# Patient Record
Sex: Female | Born: 1980 | Race: White | Hispanic: No | Marital: Single | Smoking: Former smoker
Health system: Southern US, Community
[De-identification: ages and names within clinical notes are randomized; demographics above are authoritative.]

## PROBLEM LIST (undated history)

## (undated) DIAGNOSIS — Z789 Other specified health status: Secondary | ICD-10-CM

## (undated) HISTORY — PX: GALLBLADDER SURGERY: SHX652

## (undated) HISTORY — PX: ANKLE SURGERY: SHX546

---
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
## 2001-08-31 ENCOUNTER — Other Ambulatory Visit: Admission: RE | Admit: 2001-08-31 | Discharge: 2001-08-31 | Payer: Self-pay | Admitting: Specialist

## 2001-08-31 ENCOUNTER — Other Ambulatory Visit: Admission: RE | Admit: 2001-08-31 | Discharge: 2001-08-31 | Payer: Self-pay | Admitting: Family Medicine

## 2011-01-31 ENCOUNTER — Emergency Department (HOSPITAL_COMMUNITY)
Admission: EM | Admit: 2011-01-31 | Discharge: 2011-01-31 | Disposition: A | Discharge status: Home / Self Care | Payer: Medicaid Other | Attending: Emergency Medicine | Admitting: Emergency Medicine

## 2011-01-31 ENCOUNTER — Emergency Department (HOSPITAL_COMMUNITY): Payer: Medicaid Other

## 2011-01-31 DIAGNOSIS — IMO0002 Reserved for concepts with insufficient information to code with codable children: Secondary | ICD-10-CM | POA: Insufficient documentation

## 2011-01-31 DIAGNOSIS — S139XXA Sprain of joints and ligaments of unspecified parts of neck, initial encounter: Secondary | ICD-10-CM | POA: Insufficient documentation

## 2011-01-31 DIAGNOSIS — M542 Cervicalgia: Secondary | ICD-10-CM | POA: Insufficient documentation

## 2011-01-31 DIAGNOSIS — T07XXXA Unspecified multiple injuries, initial encounter: Secondary | ICD-10-CM | POA: Insufficient documentation
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
## 2011-12-18 ENCOUNTER — Encounter (HOSPITAL_COMMUNITY): Payer: Self-pay | Admitting: *Deleted

## 2011-12-18 ENCOUNTER — Emergency Department (HOSPITAL_COMMUNITY): Payer: Medicaid Other

## 2011-12-18 ENCOUNTER — Emergency Department (HOSPITAL_COMMUNITY)
Admission: EM | Admit: 2011-12-18 | Discharge: 2011-12-18 | Disposition: A | Discharge status: Home / Self Care | Payer: Medicaid Other | Attending: Emergency Medicine | Admitting: Emergency Medicine

## 2011-12-18 DIAGNOSIS — X500XXA Overexertion from strenuous movement or load, initial encounter: Secondary | ICD-10-CM | POA: Insufficient documentation

## 2011-12-18 DIAGNOSIS — S82851A Displaced trimalleolar fracture of right lower leg, initial encounter for closed fracture: Secondary | ICD-10-CM

## 2011-12-18 DIAGNOSIS — Z882 Allergy status to sulfonamides status: Secondary | ICD-10-CM | POA: Insufficient documentation

## 2011-12-18 DIAGNOSIS — S82853A Displaced trimalleolar fracture of unspecified lower leg, initial encounter for closed fracture: Secondary | ICD-10-CM | POA: Insufficient documentation

## 2011-12-18 MED ORDER — ONDANSETRON HCL 4 MG PO TABS: 4.0000 mg | ORAL_TABLET | Freq: Four times a day (QID) | ORAL | Status: AC

## 2011-12-18 MED ORDER — FENTANYL CITRATE 0.05 MG/ML IJ SOLN
50.0000 ug | Freq: Once | INTRAMUSCULAR | Status: AC
  Filled 2011-12-18: qty 2

## 2011-12-18 MED ORDER — OXYCODONE-ACETAMINOPHEN 5-325 MG PO TABS: 2.0000 | ORAL_TABLET | ORAL | Status: AC | PRN

## 2011-12-18 MED ORDER — MORPHINE SULFATE 4 MG/ML IJ SOLN
4.0000 mg | Freq: Once | INTRAMUSCULAR | Status: AC
  Administered 2011-12-18: 4 mg via INTRAVENOUS
  Filled 2011-12-18: qty 1

## 2011-12-18 MED ORDER — HYDROMORPHONE HCL PF 1 MG/ML IJ SOLN
0.5000 mg | Freq: Once | INTRAMUSCULAR | Status: AC
  Administered 2011-12-18: 0.5 mg via INTRAVENOUS
  Filled 2011-12-18: qty 1

## 2011-12-18 MED ORDER — HYDROMORPHONE HCL PF 1 MG/ML IJ SOLN
1.0000 mg | Freq: Once | INTRAMUSCULAR | Status: AC
  Administered 2011-12-18: 1 mg via INTRAVENOUS
  Filled 2011-12-18: qty 1

## 2011-12-18 MED ORDER — ONDANSETRON HCL 4 MG/2ML IJ SOLN
4.0000 mg | Freq: Once | INTRAMUSCULAR | Status: AC
  Administered 2011-12-18: 4 mg via INTRAVENOUS
  Filled 2011-12-18: qty 2

## 2011-12-18 NOTE — ED Notes (Signed)
[id number]<AV> Expected date:<BR> Expected time:<BR> Means of arrival:<BR> Comments:<BR> EMS/chest wall pain and dizziness

## 2011-12-18 NOTE — ED Notes (Signed)
Spoke with Jonny Ruiz, ortho tech. Was told he'll be here in 45 min.

## 2011-12-18 NOTE — ED Notes (Signed)
Per EMS:  Pt was walking in high heels and tripped and rolled her ankle.  No serious obvious deformity, not significant swelling.  Pulses present, pt able to move toes.  Pt st's she feels crepitus.

## 2011-12-18 NOTE — ED Notes (Signed)
Patient transported to X-ray 

## 2011-12-18 NOTE — ED Notes (Signed)
Ortho tech John in with pt
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
## 2012-04-08 ENCOUNTER — Other Ambulatory Visit: Payer: Self-pay

## 2012-05-19 ENCOUNTER — Other Ambulatory Visit (HOSPITAL_COMMUNITY): Payer: Self-pay | Admitting: Obstetrics and Gynecology

## 2012-05-19 DIAGNOSIS — Z87728 Personal history of other specified (corrected) congenital malformations of nervous system and sense organs: Secondary | ICD-10-CM

## 2012-06-17 ENCOUNTER — Other Ambulatory Visit: Payer: Self-pay

## 2012-06-24 ENCOUNTER — Ambulatory Visit (HOSPITAL_COMMUNITY)
Admission: RE | Admit: 2012-06-24 | Discharge: 2012-06-24 | Disposition: A | Discharge status: Home / Self Care | Payer: Medicaid Other | Source: Ambulatory Visit | Attending: Obstetrics and Gynecology | Admitting: Obstetrics and Gynecology

## 2012-06-24 ENCOUNTER — Encounter (HOSPITAL_COMMUNITY): Payer: Self-pay

## 2012-06-24 DIAGNOSIS — Z8751 Personal history of pre-term labor: Secondary | ICD-10-CM | POA: Insufficient documentation

## 2012-06-24 DIAGNOSIS — Z1389 Encounter for screening for other disorder: Secondary | ICD-10-CM | POA: Insufficient documentation

## 2012-06-24 DIAGNOSIS — O358XX0 Maternal care for other (suspected) fetal abnormality and damage, not applicable or unspecified: Secondary | ICD-10-CM | POA: Insufficient documentation

## 2012-06-24 DIAGNOSIS — Z363 Encounter for antenatal screening for malformations: Secondary | ICD-10-CM | POA: Insufficient documentation

## 2012-06-24 DIAGNOSIS — Z87728 Personal history of other specified (corrected) congenital malformations of nervous system and sense organs: Secondary | ICD-10-CM

## 2012-06-24 DIAGNOSIS — O352XX0 Maternal care for (suspected) hereditary disease in fetus, not applicable or unspecified: Secondary | ICD-10-CM | POA: Insufficient documentation

## 2012-06-28 ENCOUNTER — Ambulatory Visit (HOSPITAL_COMMUNITY): Payer: No Typology Code available for payment source

## 2013-04-29 ENCOUNTER — Encounter (HOSPITAL_COMMUNITY): Payer: Self-pay | Admitting: *Deleted

## 2014-04-02 ENCOUNTER — Encounter (HOSPITAL_COMMUNITY): Payer: Self-pay | Admitting: *Deleted

## 2016-05-29 ENCOUNTER — Other Ambulatory Visit (HOSPITAL_COMMUNITY): Payer: Self-pay | Admitting: Obstetrics and Gynecology

## 2016-05-29 ENCOUNTER — Encounter (HOSPITAL_COMMUNITY): Payer: Self-pay | Admitting: Obstetrics and Gynecology

## 2016-05-29 DIAGNOSIS — Q059 Spina bifida, unspecified: Secondary | ICD-10-CM

## 2016-05-29 DIAGNOSIS — Z3689 Encounter for other specified antenatal screening: Secondary | ICD-10-CM

## 2016-05-29 DIAGNOSIS — Z3A21 21 weeks gestation of pregnancy: Secondary | ICD-10-CM

## 2016-05-29 DIAGNOSIS — O280 Abnormal hematological finding on antenatal screening of mother: Secondary | ICD-10-CM

## 2016-06-10 ENCOUNTER — Encounter (HOSPITAL_COMMUNITY): Payer: Self-pay | Admitting: *Deleted

## 2016-06-11 ENCOUNTER — Ambulatory Visit (HOSPITAL_COMMUNITY)
Admission: RE | Admit: 2016-06-11 | Discharge: 2016-06-11 | Disposition: A | Discharge status: Home / Self Care | Payer: Medicaid Other | Source: Ambulatory Visit | Attending: Specialist | Admitting: Specialist

## 2016-06-11 ENCOUNTER — Encounter (HOSPITAL_COMMUNITY): Payer: Self-pay

## 2016-06-11 DIAGNOSIS — Z315 Encounter for genetic counseling: Secondary | ICD-10-CM | POA: Diagnosis not present

## 2016-06-11 DIAGNOSIS — Z3A21 21 weeks gestation of pregnancy: Secondary | ICD-10-CM

## 2016-06-11 DIAGNOSIS — O99322 Drug use complicating pregnancy, second trimester: Secondary | ICD-10-CM | POA: Diagnosis not present

## 2016-06-11 DIAGNOSIS — Z363 Encounter for antenatal screening for malformations: Secondary | ICD-10-CM | POA: Diagnosis not present

## 2016-06-11 DIAGNOSIS — F1120 Opioid dependence, uncomplicated: Secondary | ICD-10-CM

## 2016-06-11 DIAGNOSIS — Z3689 Encounter for other specified antenatal screening: Secondary | ICD-10-CM

## 2016-06-11 DIAGNOSIS — O280 Abnormal hematological finding on antenatal screening of mother: Secondary | ICD-10-CM | POA: Insufficient documentation

## 2016-06-11 DIAGNOSIS — O09522 Supervision of elderly multigravida, second trimester: Secondary | ICD-10-CM | POA: Insufficient documentation

## 2016-06-11 DIAGNOSIS — O285 Abnormal chromosomal and genetic finding on antenatal screening of mother: Secondary | ICD-10-CM | POA: Insufficient documentation

## 2016-06-11 DIAGNOSIS — Q059 Spina bifida, unspecified: Secondary | ICD-10-CM

## 2016-06-11 DIAGNOSIS — O283 Abnormal ultrasonic finding on antenatal screening of mother: Secondary | ICD-10-CM | POA: Insufficient documentation

## 2016-06-11 DIAGNOSIS — O09529 Supervision of elderly multigravida, unspecified trimester: Secondary | ICD-10-CM | POA: Insufficient documentation

## 2016-06-11 HISTORY — DX: Other specified health status: Z78.9

## 2016-06-11 NOTE — Consult Note (Addendum)
MFM consult  48/36 yr old [id number] at 4w2dwith reported history of "spina bifida" and previous late preterm delivery referred by Dr. DMicah Noelfor fetal anatomic survey and consult. Pregnancy complicated by suboxone maintenance and elevated inhibin of 2.19MoM.  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 Fetal biometry is consistent with dating. Posterior placenta without evidence of previa. Normal amniotic fluid volume.  Normal transabdominal cervical length. The views of the profile/nasal bone, palate, and heart are limited. The remainder of the limited fetal anatomic survey is normal.  I counseled the patient as follows: 1. Appropriate fetal growth. 2. Limited fetal anatomic survey: - recommend follow up in 4 weeks to reevalute fetal anatomy 3. Elevated inhibin: - patient met with the genetic counselor; see separate report - had elevated risk of Down Syndrome on quad screen; had low risk cell free fetal DNA - I discussed the association with fetal aneuploidy. I also discussed the association with increased risk of adverse pregnancy outcomes including increased risk of fetal growth restriction, preeclampsia, stillbirth, preterm birth, and abruption. I discussed these risks are only slightly increased over baseline but heightened surveillance is warranted. I recommend serial fetal growth ultrasounds. I recommend antenatal testing only in the setting of fetal growth restriction. I recommend close surveillance for the development of signs/symptoms of preterm labor, PPROM, and preeclampsia.  4. Suboxone maintenance: - in patients who have an addiction to narcotics or have chronic pain, buprenorphine maintenance therapy has been shown to decrease adverse events in pregnant women  when compared to continuing non controlled doses of narcotics. A stable buprenorphine dose reduces stress on the fetus from repeated withdrawal due to inconsistent availability of illicit drugs. Additional benefits include a potential reduction in drug-seeking behaviors  - it is unclear if buprenorphine use is associated with teratogenicity; there have been some reports to suggest a slight increase in congenital anomalies with buprenorphine use  - there is an increased risk of preterm delivery, fetal growth restriction, and NICU admission with buprenorphine therapy in pregnancy  - there is a risk of neonatal abstinence syndrome. I explained the neonate will be closely monitored for signs/symptoms of withdrawal and will be treated as necessary. The neonates are often treated with opiates and slowly weaned- a process that can take days to weeks  - the recommendations are to continue a maintenance dose of buprenorphine in pregnancy as stopping buprenorphine in pregnancy has a higher risk of withdrawal, fetal death, and risk of use of illicit drugs  - buprenorphine should be continued after delivery. Additional pain medication should be given as indicated. Buprenorphine should not be used to treat pain related to delivery. Nubain should be avoided given it is a partial opiate antagonist.  - In the treatment of addiction involving opioid use in pregnant women, the buprenorphine/naloxone combination product is not recommended for use (insufficient evidence); however, the buprenorphine monoproduct is a reasonable and recommended option for use. Women who become pregnant while on this combination should generally be transitioned to the single agent (buprenorphine) product (ACOG 2012). I advised the patient to discuss this with her provider.  - I recommend serial ultrasounds for fetal growth every 4 weeks. Antenatal testing should be started only in the setting of fetal growth restriction.  - recommend fetal growth  every 4 weeks -  recommend inform pediatrics at delivery 5. Late preterm/early term delivery: - patient reports she delivered 3-4 weeks early  - at the time she was very sick with the flu and had PPROM and was subsequently induced given gestational age - discussed if she did deliver preterm there is increased risk in this pregnancy of preterm delivery; however that risk is decreased given 2 other full term deliveries - discussed option of makena in reducing this risk; however given delivered close to term or at term it would be reasonable for her to choose to use makena or not - recommend preterm labor precautions 6. 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 as an infant and was told she has "brain changes consistent with spina bifida" - she has never had surgery; does not know what the brain changes are; and has no neurologic sequelae - encouraged the patient to obtain her old records and speak with her mother to obtain more information - she reports no headaches or vision changes and reports never had a VP shunt - regardless given she is asymptomatic and did well with her 3 other vaginal deliveries (and had regional anesthesia) I discussed she is likely to do well from this standpoint in this pregnancy as well - recommend Anesthesia consult in the third trimester 7. Advanced maternal age: - patient met with the genetic counselor; see separate report - low risk cell free fetal DNA  I spent a total of 45 minutes with the patient of which >50% was in face to face consultation.  [place], MD

## 2016-06-11 NOTE — Progress Notes (Signed)
Genetic Counseling  High-Risk Gestation Note  Appointment Date:  06/11/2016 Referred By: Kelli Bustard, MD Date of Birth:  1981/01/31   Pregnancy History: [id number] Estimated Date of Delivery: 10/20/16 Estimated Gestational Age: [redacted]w[redacted]d Attending: Eulis Foster, MD   Kelli Sparks was seen for genetic counseling regarding a maternal age of 36 years old at delivery and an increased risk for Down syndrome based on Quad screen through [employer].   In summary:  Discussed AMA and associated risk for fetal aneuploidy  Reviewed results of previous screening  Quad screening: 1 in 172 Down syndrome risk  NIPS ([facility]) - low risk (1 in >10,000) for Down syndrome, trisomy 78, and trisomy 19  Discussed options for additional screening  Ultrasound- performed today, complete results under separate cover  Discussed diagnostic testing options  Amniocentesis- declined  Reviewed family history concerns  Patient reported personal history of possible spina bifida- reported no symptoms and has not required surgical correction  Discussed that description could possibly fit with spina bifida occulta  In the case of asymptomatic spina bifida occulta, recurrence risk for ONTDs would not be increased for relatives  Additional information regarding this medical history may alter recurrence risk assessment  Discussed carrier screening options (CF, SMA, hemoglobinopathies) - declined  She was counseled regarding maternal age and the association with risk for chromosome conditions due to nondisjunction with aging of the ova.  We reviewed chromosomes, nondisjunction, and the associated risk for fetal aneuploidy at [redacted]w[redacted]d gestation related to a maternal age of 36 years old at delivery.  They were counseled that the risk for aneuploidy decreases as gestational age increases, accounting for those pregnancies which spontaneously abort.  We specifically discussed Down syndrome (trisomy 34),  trisomies 62 and 56, and sex chromosome aneuploidies (47,XXX and 47,XXY) including the common features and prognoses of each.   We reviewed Kelli Sparks' maternal serum Quad screen result and the associated increase in risk for fetal Down syndrome (1 in 324 to 1 in 172).  She was counseled regarding other explanations for a screen positive result including normal variation and differences in maternal metabolism.  In addition, we reviewed the screen adjusted reduction in risks for trisomy 18 and ONTDs. The levels of one of the analytes, DIA, on the Quad screen was elevated (2.19 MoM). This is associated with an increased risk for adverse pregnancy outcomes and thus, a follow up ultrasound in the third trimester to assess fetal growth may be warranted. She understands that Quad screening provides a pregnancy specific risk for Down syndrome, but is not considered to be diagnostic.    Kelli Sparks subsequently had noninvasive prenatal screening (NIPS)/cell free DNA (cfDNA) testing performed through her OB office. We reviewed these results. Specifically, Kelli Sparks had [facility] testing. We reviewed that the results from this testing are within normal range, indicating less than 1 in 10,000 risk for fetal Down syndrome (trisomy 21), trisomy 61, and trisomy 80.   We reviewed that NIPS analyzes specific placental (fetal) DNA in maternal circulation. NIPS is considered to be highly specific and sensitive, but is not considered to be a diagnostic test. We reviewed that this testing identifies the majority of pregnancies with trisomies 21, 67, and 18.   We reviewed other available options including detailed ultrasound and amniocentesis. We reviewed the approximate 1 in 300-500 risk for complications for amniocentesis, including spontaneous pregnancy loss. Kelli Sparks understands that given the normal NIPS ([facility]) result, the risk of fetal aneuploidy is  much lower than the risk of a complication related to  diagnostic invasive testing. After consideration of these options, she declined amniocentesis.  A complete ultrasound was performed today. The ultrasound report will be documented separately. She understands that screening cannot diagnose or rule out all birth defects or genetic conditions.   Kelli Sparks was provided with written information regarding cystic fibrosis (CF), spinal muscular atrophy (SMA) and hemoglobinopathies including the carrier frequency, availability of carrier screening and prenatal diagnosis if indicated.  In addition, we discussed that CF and hemoglobinopathies are routinely screened for as part of the [facility] newborn screening panel.  After further discussion, she declined screening for CF, SMA and hemoglobinopathies.  Both family histories were reviewed and found to be contributory for reported spina bifida for the patient. 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 She reported that she has never required surgical correction for spina bifida, does not have symptoms, and has not been seen by neurology. She reported that she does not have a tuft of hair on her spine, to the best of her knowledge.  We discussed that the description does not fit with open spina bifida (open neural tube defects) but could possibly fit with spina bifida occulta. Spina bifida occulta, or closed spina bifida, often describes an absence of one or two vertebral arches, without a visible lesion, which may be discovered incidentally following an X-ray for backache or other unrelated symptoms. Some forms of spina bifida occulta can cause symptoms, such as the presence of a tethered cord. Limited information exists regarding the prevalence and underlying cause of spina bifida occulta. Studies suggest that in cases of asymptomatic spina bifida occulta, recurrence risk to relatives is  not likely increased. In the case of symptomatic spina bifida occulta, recurrence risk for spina bifida may be similar to that of open neural tube defects. The reported personal history for the patient is not suggestive of an increased risk for ONTDs for relatives. However, we reviewed the benefits and limitations of various screening methodologies for open neural tube defects in pregnancy. Additional information regarding this medical history is needed to more accurately assess recurrence risk for relatives.   Kelli Sparks reported a female paternal first cousin, the youngest of the sibship, with Down syndrome. He is currently in his 7240's and lives with his parents. She reported that he has facial features that are consistent with Down syndrome. We discussed that 95% of cases of Down syndrome are not inherited and are the result of non-disjunction.  Three to 4% of cases of Down syndrome are the result of a translocation involving chromosome #21.  We discussed the option of chromosome analysis to determine if an individual is a carrier of a balanced translocation involving chromosome #21.  If an individual carries a balanced translocation involving chromosome #21, then the chance to have a baby with Down syndrome would be greater than the maternal age-related risk.  The reported family history is most suggestive of sporadic Down syndrome. However, additional information regarding this relative may alter recurrence risk assessment.  Without further information regarding the provided family history, an accurate genetic risk cannot be calculated. Further genetic counseling is warranted if more information is obtained.   Kelli Sparks denied exposure to environmental toxins or chemical agents. She denied the use of alcohol, tobacco or street drugs. She denied  significant viral illnesses during the course of her pregnancy. Her medical and surgical histories were contributory for suboxone use in current pregnancy.  See separate MFM consult note from today's visit for detailed discussion regarding patient's medical history.      I counseled Kelli Sparks regarding the above risks and available options.  The approximate face-to-face time with the genetic counselor was 40 minutes.    Quinn Plowman, MS,  Certified Genetic Counselor 06/11/2016

## 2016-06-12 ENCOUNTER — Other Ambulatory Visit (HOSPITAL_COMMUNITY): Payer: Self-pay | Admitting: *Deleted

## 2016-06-12 DIAGNOSIS — IMO0002 Reserved for concepts with insufficient information to code with codable children: Secondary | ICD-10-CM

## 2016-06-12 DIAGNOSIS — Z0489 Encounter for examination and observation for other specified reasons: Secondary | ICD-10-CM

## 2016-07-07 ENCOUNTER — Other Ambulatory Visit (HOSPITAL_COMMUNITY): Payer: Self-pay

## 2016-07-09 ENCOUNTER — Ambulatory Visit (HOSPITAL_COMMUNITY)
Admission: RE | Admit: 2016-07-09 | Discharge: 2016-07-09 | Disposition: A | Discharge status: Home / Self Care | Payer: Medicaid Other | Source: Ambulatory Visit | Attending: Specialist | Admitting: Specialist

## 2016-07-09 ENCOUNTER — Encounter (HOSPITAL_COMMUNITY): Payer: Self-pay

## 2016-12-17 DIAGNOSIS — R002 Palpitations: Secondary | ICD-10-CM

## 2016-12-21 DIAGNOSIS — R0602 Shortness of breath: Secondary | ICD-10-CM | POA: Diagnosis not present

## 2017-04-05 ENCOUNTER — Encounter (HOSPITAL_COMMUNITY): Payer: Self-pay
